# Patient Record
Sex: Female | Born: 1980
Health system: Southern US, Community
[De-identification: ages and names within clinical notes are randomized; demographics above are authoritative.]

## PROBLEM LIST (undated history)

## (undated) DIAGNOSIS — Z0000 Encounter for general adult medical examination without abnormal findings: Secondary | ICD-10-CM

## (undated) HISTORY — DX: Encounter for general adult medical examination without abnormal findings: Z00.00

## (undated) HISTORY — PX: NO PAST SURGERIES: SHX2092

---

## 2008-09-02 ENCOUNTER — Other Ambulatory Visit: Admission: RE | Admit: 2008-09-02 | Discharge: 2008-09-02 | Payer: Self-pay | Admitting: Family Medicine

## 2009-09-07 ENCOUNTER — Other Ambulatory Visit: Admission: RE | Admit: 2009-09-07 | Discharge: 2009-09-07 | Payer: Self-pay | Admitting: Family Medicine

## 2010-09-03 ENCOUNTER — Other Ambulatory Visit
Admission: RE | Admit: 2010-09-03 | Discharge: 2010-09-03 | Payer: Self-pay | Source: Home / Self Care | Admitting: Family Medicine

## 2010-09-03 ENCOUNTER — Other Ambulatory Visit: Payer: Self-pay | Admitting: Family Medicine

## 2011-05-22 ENCOUNTER — Emergency Department (HOSPITAL_COMMUNITY)
Admission: EM | Admit: 2011-05-22 | Discharge: 2011-05-22 | Disposition: A | Discharge status: Home / Self Care | Payer: BC Managed Care – PPO | Attending: Emergency Medicine | Admitting: Emergency Medicine

## 2011-05-22 DIAGNOSIS — Y9229 Other specified public building as the place of occurrence of the external cause: Secondary | ICD-10-CM | POA: Insufficient documentation

## 2011-05-22 DIAGNOSIS — S01501A Unspecified open wound of lip, initial encounter: Secondary | ICD-10-CM | POA: Insufficient documentation

## 2011-05-22 DIAGNOSIS — W268XXA Contact with other sharp object(s), not elsewhere classified, initial encounter: Secondary | ICD-10-CM | POA: Insufficient documentation

## 2014-03-27 ENCOUNTER — Emergency Department (HOSPITAL_COMMUNITY): Payer: PRIVATE HEALTH INSURANCE

## 2014-03-27 ENCOUNTER — Emergency Department (HOSPITAL_COMMUNITY)
Admission: EM | Admit: 2014-03-27 | Discharge: 2014-03-28 | Disposition: A | Discharge status: Home / Self Care | Payer: PRIVATE HEALTH INSURANCE | Attending: Emergency Medicine | Admitting: Emergency Medicine

## 2014-03-27 ENCOUNTER — Encounter (HOSPITAL_COMMUNITY): Payer: Self-pay | Admitting: Emergency Medicine

## 2014-03-27 DIAGNOSIS — M549 Dorsalgia, unspecified: Secondary | ICD-10-CM | POA: Insufficient documentation

## 2014-03-27 DIAGNOSIS — Z3202 Encounter for pregnancy test, result negative: Secondary | ICD-10-CM | POA: Insufficient documentation

## 2014-03-27 DIAGNOSIS — R071 Chest pain on breathing: Secondary | ICD-10-CM | POA: Insufficient documentation

## 2014-03-27 DIAGNOSIS — F17200 Nicotine dependence, unspecified, uncomplicated: Secondary | ICD-10-CM | POA: Insufficient documentation

## 2014-03-27 DIAGNOSIS — R0789 Other chest pain: Secondary | ICD-10-CM

## 2014-03-27 LAB — BASIC METABOLIC PANEL
Anion gap: 15 (ref 5–15)
BUN: 11 mg/dL (ref 6–23)
CO2: 20 mEq/L (ref 19–32)
Calcium: 9.2 mg/dL (ref 8.4–10.5)
Chloride: 104 mEq/L (ref 96–112)
Creatinine, Ser: 0.7 mg/dL (ref 0.50–1.10)
Glucose, Bld: 85 mg/dL (ref 70–99)
POTASSIUM: 4 meq/L (ref 3.7–5.3)
Sodium: 139 mEq/L (ref 137–147)

## 2014-03-27 LAB — CBC
HCT: 39.8 % (ref 36.0–46.0)
Hemoglobin: 13.4 g/dL (ref 12.0–15.0)
MCH: 31.2 pg (ref 26.0–34.0)
MCHC: 33.7 g/dL (ref 30.0–36.0)
MCV: 92.6 fL (ref 78.0–100.0)
Platelets: 230 10*3/uL (ref 150–400)
RBC: 4.3 MIL/uL (ref 3.87–5.11)
RDW: 12.5 % (ref 11.5–15.5)
WBC: 9.6 10*3/uL (ref 4.0–10.5)

## 2014-03-27 LAB — I-STAT TROPONIN, ED: TROPONIN I, POC: 0 ng/mL (ref 0.00–0.08)

## 2014-03-27 LAB — POC URINE PREG, ED: Preg Test, Ur: NEGATIVE

## 2014-03-27 MED ORDER — OXYCODONE-ACETAMINOPHEN 5-325 MG PO TABS
1.0000 | ORAL_TABLET | Freq: Once | ORAL | Status: AC
  Administered 2014-03-27: 1 via ORAL
  Filled 2014-03-27: qty 1

## 2014-03-27 NOTE — ED Notes (Signed)
Spoke with Melvenia BeamShari, [place] regarding pt.  Chest pain protocol followed.

## 2014-03-27 NOTE — ED Notes (Addendum)
C/o sudden onset of sharp pain under L shoulder blade x 30 min.  States if she stands up straight the pain is unbearable but if she bends over it relieves pain a little bit.  No known injury.  Denies chest pain/sob.

## 2014-03-28 LAB — I-STAT TROPONIN, ED: Troponin i, poc: 0 ng/mL (ref 0.00–0.08)

## 2014-03-28 MED ORDER — OXYCODONE-ACETAMINOPHEN 5-325 MG PO TABS
2.0000 | ORAL_TABLET | Freq: Once | ORAL | Status: AC
  Administered 2014-03-28: 2 via ORAL
  Filled 2014-03-28: qty 2

## 2014-03-28 MED ORDER — OXYCODONE-ACETAMINOPHEN 5-325 MG PO TABS: 1.0000 | ORAL_TABLET | Freq: Three times a day (TID) | ORAL | Status: DC | PRN

## 2014-03-28 NOTE — ED Provider Notes (Signed)
CSN: [id number]     Arrival date & time 03/27/14  1958 History   First MD Initiated Contact with Patient 03/28/14 [phone]     Chief Complaint  Patient presents with  . Back Pain     (Consider location/radiation/quality/duration/timing/severity/associated sxs/prior Treatment) HPI Sudden sharp stabbing left upper posterior back pain constant worse with position changes no associated symptoms; no fever, cough, CP, SOB, weak, numb, syncope.  Partially reproducible left upper back tenderness  PERC neg  History reviewed. No pertinent past medical history. History reviewed. No pertinent past surgical history. No family history on file. History  Substance Use Topics  . Smoking status: Current Every Day Smoker  . Smokeless tobacco: Not on file  . Alcohol Use: Yes   OB History   Grav Para Term Preterm Abortions TAB SAB Ect Mult Living                 Review of Systems  10 Systems reviewed and are negative for acute change except as noted in the HPI.  Allergies  Review of patient's allergies indicates no known allergies.  Home Medications   Prior to Admission medications   Medication Sig Start Date End Date Taking? Authorizing Provider  oxyCODONE-acetaminophen (PERCOCET) 5-325 MG per tablet Take 1-2 tablets by mouth every 8 (eight) hours as needed for severe pain. 03/28/14   Hurman HornJohn M Niasha Devins, MD   BP 115/67  Pulse 69  Temp(Src) 98.1 F (36.7 C) (Oral)  Resp 17  Ht 5\' 4"  (1.626 m)  Wt 146 lb (66.225 kg)  BMI 25.05 kg/m2  SpO2 98% Physical Exam  Nursing note and vitals reviewed. Constitutional:  Awake, alert, nontoxic appearance.  HENT:  Head: Atraumatic.  Eyes: Right eye exhibits no discharge. Left eye exhibits no discharge.  Neck: Neck supple.  Cardiovascular: Normal rate and regular rhythm.   No murmur heard. Pulmonary/Chest: Effort normal and breath sounds normal. No respiratory distress. She has no wheezes. She has no rales. She exhibits no tenderness.  Normal RA pulse  ox see VS  Abdominal: Soft. There is no tenderness. There is no rebound.  Musculoskeletal: She exhibits no edema and no tenderness.  Baseline ROM, no obvious new focal weakness. Partially reproducible left upper back tenderness  Neurological: She is alert.  Mental status and motor strength appears baseline for patient and situation.  Skin: No rash noted.  Psychiatric: She has a normal mood and affect.    ED Course  Procedures (including critical care time) Labs Review Labs Reviewed  CBC  BASIC METABOLIC PANEL  I-STAT TROPOININ, ED  POC URINE PREG, ED  I-STAT TROPOININ, ED    Imaging Review No results found. Dg Chest 2 View  03/27/2014   CLINICAL DATA:  Left-sided chest and upper back pain under scapula today  EXAM: CHEST  2 VIEW  COMPARISON:  None.  FINDINGS: The heart size and mediastinal contours are within normal limits. Both lungs are clear. The visualized skeletal structures are unremarkable.  IMPRESSION: No active cardiopulmonary disease.   Electronically Signed   By: Esperanza Heiraymond  Rubner M.D.   On: 03/27/2014 20:56   EKG Interpretation   Date/Time:  Thursday March 27 2014 20:11:10 EDT Ventricular Rate:  92 PR Interval:  134 QRS Duration: 74 QT Interval:  354 QTC Calculation: 437 R Axis:   54 Text Interpretation:  Normal sinus rhythm Normal ECG No previous ECGs  available Confirmed by [facility]  MD, Jonny RuizJOHN ([id number]) on 03/28/2014 1:37:53 AM      MDM   Final  diagnoses:  Chest wall pain    Patient informed of clinical course, understand medical decision-making process, and agree with plan. I doubt any other EMC precluding discharge at this time including, but not necessarily limited to the following:AMI, PE, PTX.   Hurman Horn, MD 03/29/14 2251

## 2014-03-28 NOTE — Discharge Instructions (Signed)
You have been diagnosed by your caregiver as having chest wall pain. °SEEK IMMEDIATE MEDICAL ATTENTION IF: °You develop a fever.  °Your chest pains become severe or intolerable.  °You develop new, unexplained symptoms (problems).  °You develop shortness of breath, nausea, vomiting, sweating or feel light headed.  °You develop a new cough or you cough up blood. ° °

## 2014-03-28 NOTE — ED Notes (Signed)
Patient offered wheelchair, patient declined. RN escorted to exit. 

## 2015-02-05 ENCOUNTER — Other Ambulatory Visit: Payer: Self-pay | Admitting: Certified Nurse Midwife

## 2017-02-24 DIAGNOSIS — R197 Diarrhea, unspecified: Secondary | ICD-10-CM | POA: Diagnosis not present

## 2017-03-28 DIAGNOSIS — K529 Noninfective gastroenteritis and colitis, unspecified: Secondary | ICD-10-CM | POA: Diagnosis not present

## 2017-03-29 DIAGNOSIS — K529 Noninfective gastroenteritis and colitis, unspecified: Secondary | ICD-10-CM | POA: Diagnosis not present

## 2017-03-31 DIAGNOSIS — R197 Diarrhea, unspecified: Secondary | ICD-10-CM | POA: Diagnosis not present

## 2017-09-11 DIAGNOSIS — J209 Acute bronchitis, unspecified: Secondary | ICD-10-CM | POA: Diagnosis not present

## 2017-10-02 DIAGNOSIS — Z803 Family history of malignant neoplasm of breast: Secondary | ICD-10-CM | POA: Diagnosis not present

## 2017-10-02 DIAGNOSIS — Z1239 Encounter for other screening for malignant neoplasm of breast: Secondary | ICD-10-CM | POA: Diagnosis not present

## 2017-10-02 DIAGNOSIS — Z6826 Body mass index (BMI) 26.0-26.9, adult: Secondary | ICD-10-CM | POA: Diagnosis not present

## 2017-10-02 DIAGNOSIS — Z01419 Encounter for gynecological examination (general) (routine) without abnormal findings: Secondary | ICD-10-CM | POA: Diagnosis not present

## 2017-10-05 ENCOUNTER — Other Ambulatory Visit: Payer: Self-pay | Admitting: Obstetrics and Gynecology

## 2017-10-05 DIAGNOSIS — Z803 Family history of malignant neoplasm of breast: Secondary | ICD-10-CM

## 2017-11-15 DIAGNOSIS — Z809 Family history of malignant neoplasm, unspecified: Secondary | ICD-10-CM | POA: Diagnosis not present

## 2018-09-17 DIAGNOSIS — S99912A Unspecified injury of left ankle, initial encounter: Secondary | ICD-10-CM | POA: Diagnosis not present

## 2018-10-22 DIAGNOSIS — Z6826 Body mass index (BMI) 26.0-26.9, adult: Secondary | ICD-10-CM | POA: Diagnosis not present

## 2018-10-22 DIAGNOSIS — Z01419 Encounter for gynecological examination (general) (routine) without abnormal findings: Secondary | ICD-10-CM | POA: Diagnosis not present

## 2018-11-09 DIAGNOSIS — R05 Cough: Secondary | ICD-10-CM | POA: Diagnosis not present

## 2019-04-08 ENCOUNTER — Other Ambulatory Visit: Payer: Self-pay

## 2019-04-08 DIAGNOSIS — Z20822 Contact with and (suspected) exposure to covid-19: Secondary | ICD-10-CM

## 2019-04-10 LAB — NOVEL CORONAVIRUS, NAA: SARS-CoV-2, NAA: DETECTED — AB

## 2019-05-27 ENCOUNTER — Ambulatory Visit: Payer: PRIVATE HEALTH INSURANCE | Admitting: Neurology

## 2019-09-30 ENCOUNTER — Ambulatory Visit: Payer: 59 | Attending: Internal Medicine

## 2019-09-30 DIAGNOSIS — Z20822 Contact with and (suspected) exposure to covid-19: Secondary | ICD-10-CM

## 2019-10-01 LAB — NOVEL CORONAVIRUS, NAA: SARS-CoV-2, NAA: NOT DETECTED

## 2020-01-09 ENCOUNTER — Other Ambulatory Visit: Payer: Self-pay | Admitting: Obstetrics and Gynecology

## 2020-01-09 DIAGNOSIS — Z9189 Other specified personal risk factors, not elsewhere classified: Secondary | ICD-10-CM

## 2021-03-02 DIAGNOSIS — Z6825 Body mass index (BMI) 25.0-25.9, adult: Secondary | ICD-10-CM | POA: Diagnosis not present

## 2021-03-02 DIAGNOSIS — Z1231 Encounter for screening mammogram for malignant neoplasm of breast: Secondary | ICD-10-CM | POA: Diagnosis not present

## 2021-03-02 DIAGNOSIS — Z01419 Encounter for gynecological examination (general) (routine) without abnormal findings: Secondary | ICD-10-CM | POA: Diagnosis not present

## 2021-03-08 ENCOUNTER — Other Ambulatory Visit: Payer: Self-pay | Admitting: Obstetrics and Gynecology

## 2021-03-08 DIAGNOSIS — Z9189 Other specified personal risk factors, not elsewhere classified: Secondary | ICD-10-CM

## 2021-03-08 DIAGNOSIS — R928 Other abnormal and inconclusive findings on diagnostic imaging of breast: Secondary | ICD-10-CM

## 2021-03-15 ENCOUNTER — Ambulatory Visit: Payer: Self-pay

## 2021-03-15 ENCOUNTER — Other Ambulatory Visit: Payer: Self-pay

## 2021-03-15 ENCOUNTER — Ambulatory Visit
Admission: RE | Admit: 2021-03-15 | Discharge: 2021-03-15 | Disposition: A | Discharge status: Home / Self Care | Payer: BC Managed Care – PPO | Source: Ambulatory Visit | Attending: Obstetrics and Gynecology | Admitting: Obstetrics and Gynecology

## 2021-03-15 DIAGNOSIS — R922 Inconclusive mammogram: Secondary | ICD-10-CM | POA: Diagnosis not present

## 2021-03-15 DIAGNOSIS — Z803 Family history of malignant neoplasm of breast: Secondary | ICD-10-CM | POA: Diagnosis not present

## 2021-03-15 DIAGNOSIS — R928 Other abnormal and inconclusive findings on diagnostic imaging of breast: Secondary | ICD-10-CM | POA: Diagnosis not present

## 2021-03-19 ENCOUNTER — Other Ambulatory Visit: Payer: 59

## 2021-08-12 DIAGNOSIS — R051 Acute cough: Secondary | ICD-10-CM | POA: Diagnosis not present

## 2022-02-19 DIAGNOSIS — W57XXXA Bitten or stung by nonvenomous insect and other nonvenomous arthropods, initial encounter: Secondary | ICD-10-CM | POA: Diagnosis not present

## 2022-02-19 DIAGNOSIS — S20469A Insect bite (nonvenomous) of unspecified back wall of thorax, initial encounter: Secondary | ICD-10-CM | POA: Diagnosis not present

## 2022-02-19 DIAGNOSIS — T148XXA Other injury of unspecified body region, initial encounter: Secondary | ICD-10-CM | POA: Diagnosis not present

## 2022-04-07 DIAGNOSIS — Z6824 Body mass index (BMI) 24.0-24.9, adult: Secondary | ICD-10-CM | POA: Diagnosis not present

## 2022-04-07 DIAGNOSIS — Z1231 Encounter for screening mammogram for malignant neoplasm of breast: Secondary | ICD-10-CM | POA: Diagnosis not present

## 2022-04-07 DIAGNOSIS — Z01419 Encounter for gynecological examination (general) (routine) without abnormal findings: Secondary | ICD-10-CM | POA: Diagnosis not present

## 2022-04-08 ENCOUNTER — Other Ambulatory Visit: Payer: Self-pay | Admitting: Obstetrics and Gynecology

## 2022-04-08 DIAGNOSIS — Z9189 Other specified personal risk factors, not elsewhere classified: Secondary | ICD-10-CM

## 2022-10-03 DIAGNOSIS — F17200 Nicotine dependence, unspecified, uncomplicated: Secondary | ICD-10-CM | POA: Diagnosis not present

## 2022-10-03 DIAGNOSIS — R55 Syncope and collapse: Secondary | ICD-10-CM | POA: Diagnosis not present

## 2022-10-10 ENCOUNTER — Ambulatory Visit: Payer: BC Managed Care – PPO | Admitting: Internal Medicine

## 2022-11-07 DIAGNOSIS — F17200 Nicotine dependence, unspecified, uncomplicated: Secondary | ICD-10-CM | POA: Diagnosis not present

## 2022-11-07 DIAGNOSIS — R6882 Decreased libido: Secondary | ICD-10-CM | POA: Diagnosis not present

## 2023-03-13 IMAGING — MG MM DIGITAL DIAGNOSTIC UNILAT*L* W/ TOMO W/ CAD
4 series · 4 of 12 positions shown · non-contrast
Comparison: Previous exam(s).

CLINICAL DATA: 40-year-old female recalled from screening mammogram
dated 03/02/2021 for a possible left breast asymmetry. Patient has a
family history of breast cancer in mother, great grandmother and
great great grandmother.

EXAM:
DIGITAL DIAGNOSTIC UNILATERAL LEFT MAMMOGRAM WITH TOMOSYNTHESIS AND
CAD
TECHNIQUE: Left digital diagnostic mammography and breast tomosynthesis was
performed. The images were evaluated with computer-aided detection.

[L MLO synth-2D]
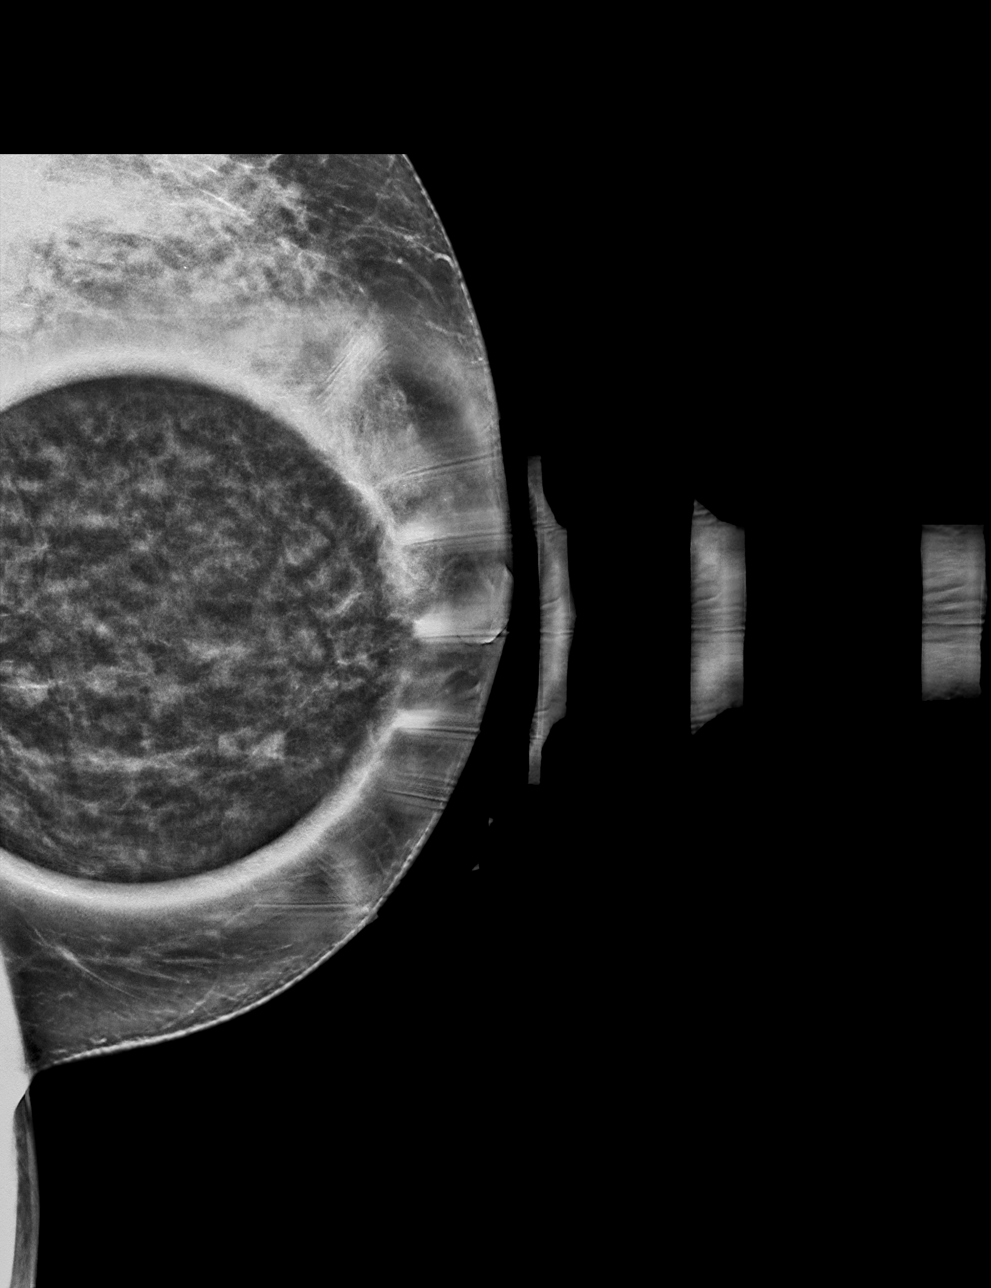

[L ML synth-2D]
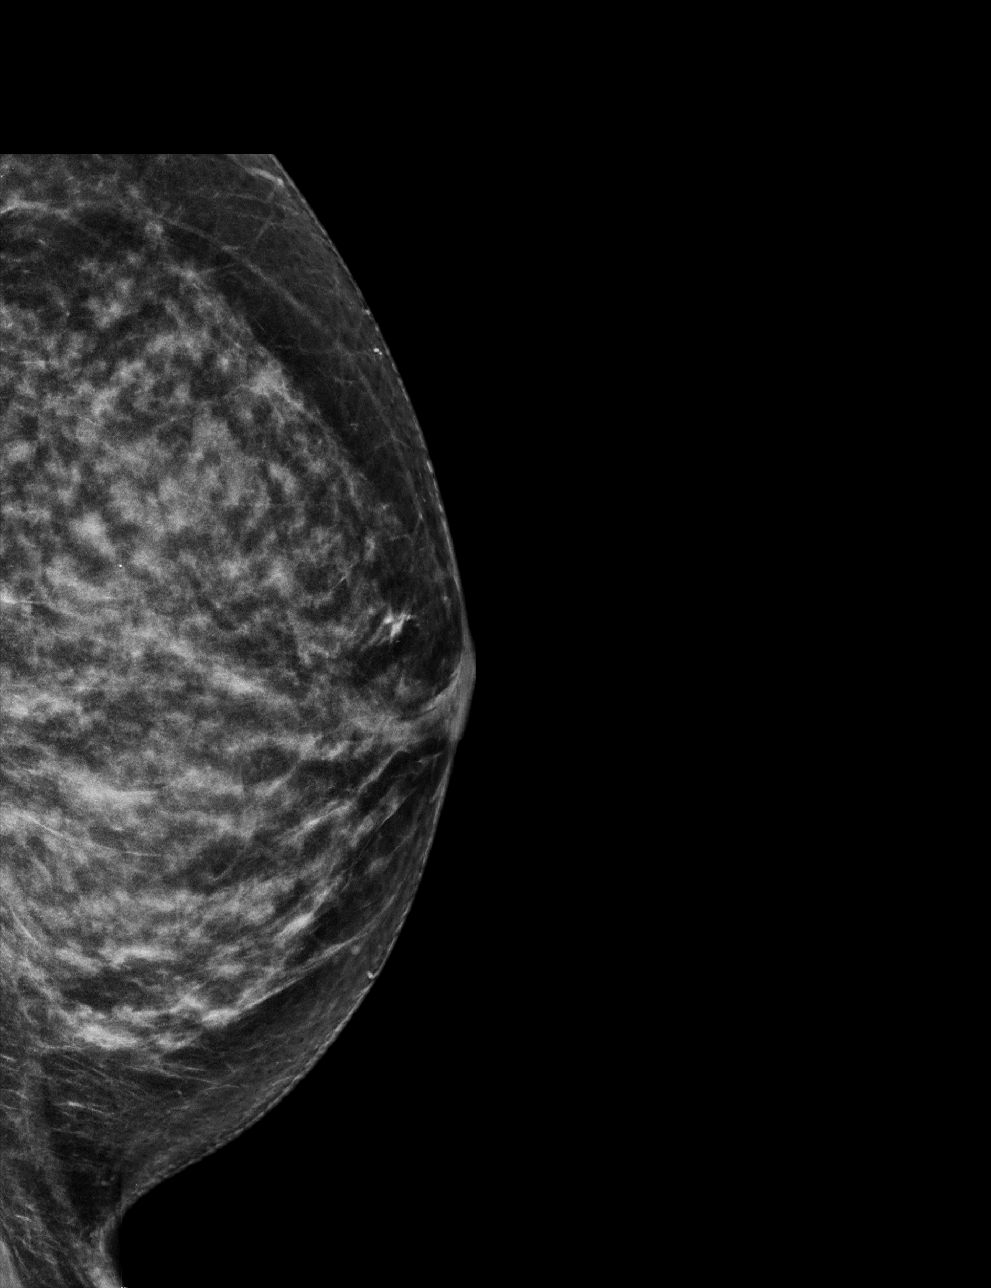

[L MLO tomo · tomo slice 31/61.0]
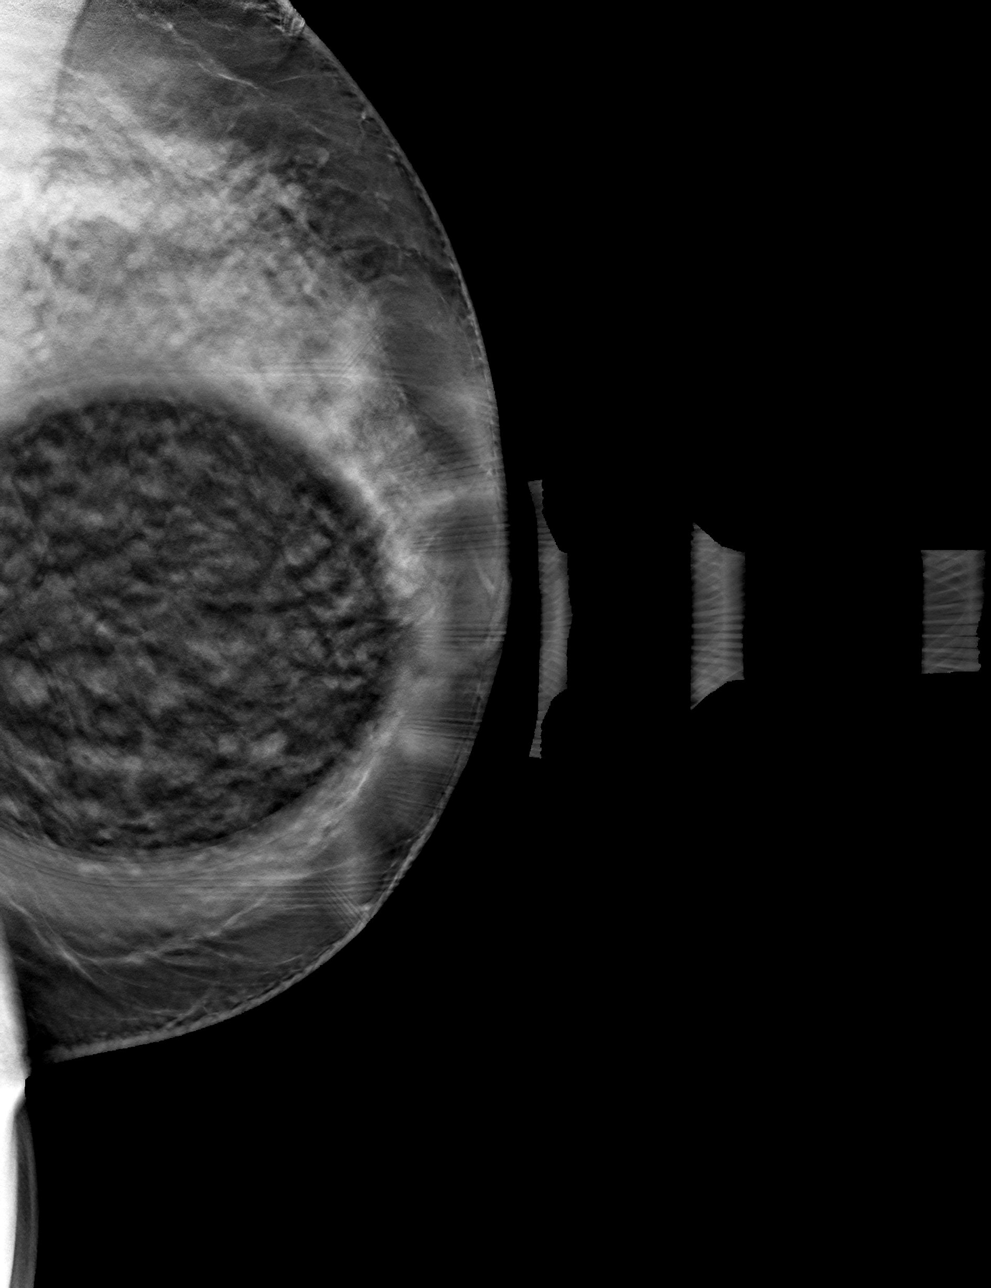

[L ML tomo · tomo slice 32/63.0]
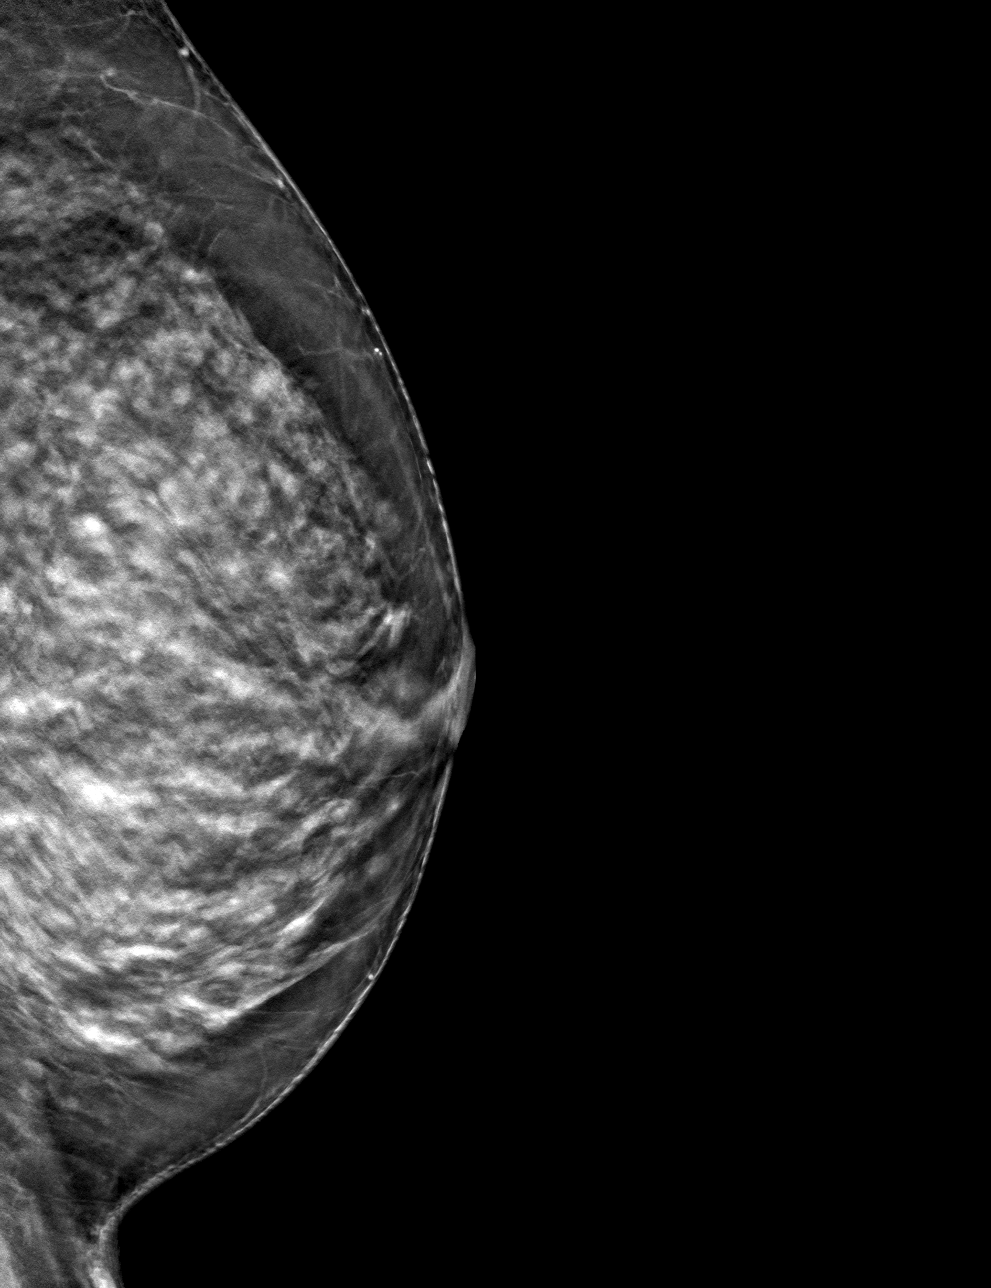

[4 of 12 positions shown; findings below may reference images not displayed]

ACR Breast Density Category c: The breast tissue is heterogeneously
dense, which may obscure small masses.
FINDINGS: Previously described, possible asymmetry in the central left breast
is seen on the MLO projection resolves into well dispersed
fibroglandular tissue on today's additional views. No suspicious
findings are identified.
IMPRESSION: No mammographic evidence of malignancy.

RECOMMENDATION:
1.  Screening mammogram in one year.(Code:VB-P-R79)
2. The American Cancer Society recommends annual MRI and mammography
in patients with an estimated lifetime risk of developing breast
cancer greater than 20 - 25%, or who are known or suspected to be
positive for the breast cancer gene.

I have discussed the findings and recommendations with the patient.
If applicable, a reminder letter will be sent to the patient
regarding the next appointment.

BI-RADS CATEGORY  1: Negative.

## 2023-04-13 DIAGNOSIS — Z01419 Encounter for gynecological examination (general) (routine) without abnormal findings: Secondary | ICD-10-CM | POA: Diagnosis not present

## 2023-04-13 DIAGNOSIS — Z6820 Body mass index (BMI) 20.0-20.9, adult: Secondary | ICD-10-CM | POA: Diagnosis not present

## 2023-04-13 DIAGNOSIS — Z1231 Encounter for screening mammogram for malignant neoplasm of breast: Secondary | ICD-10-CM | POA: Diagnosis not present

## 2023-06-22 ENCOUNTER — Other Ambulatory Visit: Payer: Self-pay | Admitting: Family Medicine

## 2023-06-22 DIAGNOSIS — R109 Unspecified abdominal pain: Secondary | ICD-10-CM | POA: Diagnosis not present

## 2023-06-23 ENCOUNTER — Ambulatory Visit
Admission: RE | Admit: 2023-06-23 | Discharge: 2023-06-23 | Disposition: A | Discharge status: Home / Self Care | Payer: BC Managed Care – PPO | Source: Ambulatory Visit | Attending: Family Medicine | Admitting: Family Medicine

## 2023-06-23 DIAGNOSIS — R109 Unspecified abdominal pain: Secondary | ICD-10-CM | POA: Diagnosis not present

## 2024-04-15 DIAGNOSIS — Z1231 Encounter for screening mammogram for malignant neoplasm of breast: Secondary | ICD-10-CM | POA: Diagnosis not present

## 2024-04-15 DIAGNOSIS — Z124 Encounter for screening for malignant neoplasm of cervix: Secondary | ICD-10-CM | POA: Diagnosis not present

## 2024-04-15 DIAGNOSIS — Z1151 Encounter for screening for human papillomavirus (HPV): Secondary | ICD-10-CM | POA: Diagnosis not present

## 2024-04-15 DIAGNOSIS — Z6820 Body mass index (BMI) 20.0-20.9, adult: Secondary | ICD-10-CM | POA: Diagnosis not present

## 2024-04-15 DIAGNOSIS — Z01419 Encounter for gynecological examination (general) (routine) without abnormal findings: Secondary | ICD-10-CM | POA: Diagnosis not present

## 2024-04-18 ENCOUNTER — Other Ambulatory Visit: Payer: Self-pay | Admitting: Obstetrics and Gynecology

## 2024-04-18 DIAGNOSIS — Z9189 Other specified personal risk factors, not elsewhere classified: Secondary | ICD-10-CM

## 2024-04-19 ENCOUNTER — Other Ambulatory Visit: Payer: Self-pay | Admitting: Obstetrics and Gynecology

## 2024-04-19 DIAGNOSIS — R928 Other abnormal and inconclusive findings on diagnostic imaging of breast: Secondary | ICD-10-CM

## 2024-04-26 ENCOUNTER — Ambulatory Visit
Admission: RE | Admit: 2024-04-26 | Discharge: 2024-04-26 | Disposition: A | Discharge status: Home / Self Care | Source: Ambulatory Visit | Attending: Obstetrics and Gynecology | Admitting: Obstetrics and Gynecology

## 2024-04-26 ENCOUNTER — Other Ambulatory Visit: Payer: Self-pay | Admitting: Obstetrics and Gynecology

## 2024-04-26 DIAGNOSIS — R928 Other abnormal and inconclusive findings on diagnostic imaging of breast: Secondary | ICD-10-CM

## 2024-04-26 DIAGNOSIS — R921 Mammographic calcification found on diagnostic imaging of breast: Secondary | ICD-10-CM

## 2024-05-01 ENCOUNTER — Other Ambulatory Visit: Payer: Self-pay | Admitting: Obstetrics and Gynecology

## 2024-05-01 ENCOUNTER — Ambulatory Visit
Admission: RE | Admit: 2024-05-01 | Discharge: 2024-05-01 | Disposition: A | Discharge status: Home / Self Care | Source: Ambulatory Visit | Attending: Obstetrics and Gynecology | Admitting: Obstetrics and Gynecology

## 2024-05-01 ENCOUNTER — Ambulatory Visit
Admission: RE | Admit: 2024-05-01 | Discharge: 2024-05-01 | Disposition: A | Discharge status: Home / Self Care | Source: Ambulatory Visit | Attending: Obstetrics and Gynecology

## 2024-05-01 DIAGNOSIS — R921 Mammographic calcification found on diagnostic imaging of breast: Secondary | ICD-10-CM

## 2024-05-21 ENCOUNTER — Encounter: Payer: Self-pay | Admitting: Pediatrics

## 2024-06-13 ENCOUNTER — Ambulatory Visit (AMBULATORY_SURGERY_CENTER)

## 2024-06-13 VITALS — Ht 64.0 in | Wt 122.0 lb

## 2024-06-13 DIAGNOSIS — Z1211 Encounter for screening for malignant neoplasm of colon: Secondary | ICD-10-CM

## 2024-06-13 MED ORDER — NA SULFATE-K SULFATE-MG SULF 17.5-3.13-1.6 GM/177ML PO SOLN: 1.0000 | Freq: Once | ORAL | 0 refills | Status: AC

## 2024-06-13 NOTE — Progress Notes (Signed)
 Pre visit completed via phone call; Patient verified name, DOB, and address; No egg or soy allergy known to patient;  No issues known to pt with past sedation with any surgeries or procedures; Patient denies ever being told they had issues or difficulty with intubation;  No FH of Malignant Hyperthermia; Pt is not on diet pills; Pt is not on home 02;  Pt is not on blood thinners;  Pt reports issues with constipation - patient reports she has a bowel movement about every other day; advised to take gentle laxative/stool softener, increase oral fluids, activity, fruits and veggies (that are allowed) during the 5 days prior to the procedure to assist with constipation;  No A fib or A flutter Have any cardiac testing pending--NO Insurance verified during PV appt--- BCBS PPO Pt can ambulate without assistance;  Pt denies use of chewing tobacco; Discussed diabetic/weight loss medication holds; Discussed NSAID holds; Checked BMI to be less than 50; Pt instructed to use Single[employer]com or GoodRx for a price reduction on prep  Patient's chart reviewed by Norleen Schillings CNRA prior to previsit and patient appropriate for the [facility];  Pre visit completed and red dot placed by patient's name on their procedure day (on provider's schedule);   Instructions sent to MyChart as well as printed and given to patient at time of PV appt;

## 2024-06-24 ENCOUNTER — Encounter: Payer: Self-pay | Admitting: Pediatrics

## 2024-06-26 NOTE — Progress Notes (Unsigned)
 [facility] History and Physical   Primary Care Physician:  Katina Pfeiffer, PA-C   Reason for Procedure:  Colorectal cancer screening  Plan:    Colonoscopy     HPI: Janet Jensen is a 43 y.o. female undergoing colonoscopy for colorectal cancer screening.  This is the patient's first colonoscopy.  No family history of colorectal cancer or polyps.  Patient denies current symptoms of rectal bleeding or change in bowel habits.   Past Medical History:  Diagnosis Date   Healthy adult on routine physical examination     Past Surgical History:  Procedure Laterality Date   NO PAST SURGERIES      Prior to Admission medications   Medication Sig Start Date End Date Taking? Authorizing Provider  buPROPion (ZYBAN) 150 MG 12 hr tablet Take 150 mg by mouth 2 (two) times daily. Patient taking differently: Take 150 mg by mouth daily.    [provider]  doxylamine, Sleep, (UNISOM) 25 MG tablet Take 25 mg by mouth at bedtime as needed for sleep.    [provider]  SLYND 4 MG TABS Take 1 tablet by mouth daily. 03/21/24   [provider]    Current Outpatient Medications  Medication Sig Dispense Refill   buPROPion (ZYBAN) 150 MG 12 hr tablet Take 150 mg by mouth 2 (two) times daily. (Patient taking differently: Take 150 mg by mouth daily.)     doxylamine, Sleep, (UNISOM) 25 MG tablet Take 25 mg by mouth at bedtime as needed for sleep.     SLYND 4 MG TABS Take 1 tablet by mouth daily.     Current Facility-Administered Medications  Medication Dose Route Frequency Provider Last Rate Last Admin   0.9 %  sodium chloride infusion  500 mL Intravenous Once Arma Reining, Inocente HERO, MD        Allergies as of 06/27/2024   (No Known Allergies)    Family History  Problem Relation Age of Onset   Breast cancer Mother 62   Breast cancer Maternal Grandmother    Colon polyps Neg Hx    Colon cancer Neg Hx    Esophageal cancer Neg Hx    Rectal cancer Neg Hx     Stomach cancer Neg Hx     Social History   Socioeconomic History   Marital status: Married    Spouse name: Not on file   Number of children: Not on file   Years of education: Not on file   Highest education level: Not on file  Occupational History   Not on file  Tobacco Use   Smoking status: Every Day    Current packs/day: 1.00    Types: Cigarettes   Smokeless tobacco: Not on file  Vaping Use   Vaping status: Never Used  Substance and Sexual Activity   Alcohol use: Not Currently   Drug use: No   Sexual activity: Not on file  Other Topics Concern   Not on file  Social History Narrative   Not on file   Social Drivers of Health   Financial Resource Strain: Not on file  Food Insecurity: Not on file  Transportation Needs: Not on file  Physical Activity: Not on file  Stress: Not on file  Social Connections: Not on file  Intimate Partner Violence: Not on file    Review of Systems:  All other review of systems negative except as mentioned in the HPI.  Physical Exam: Vital signs BP 107/64   Pulse 71   Temp 98.2 F (  36.8 C) (Skin)   Ht 5' 4 (1.626 m)   Wt 122 lb (55.3 kg)   SpO2 100%   BMI 20.94 kg/m   General:   Alert,  Well-developed, well-nourished, pleasant and cooperative in NAD Airway:  Mallampati 2 Lungs:  Clear throughout to auscultation.   Heart:  Regular rate and rhythm; no murmurs, clicks, rubs,  or gallops. Abdomen:  Soft, nontender and nondistended. Normal bowel sounds.   Neuro/Psych:  Normal mood and affect. A and O x 3  Inocente Hausen, MD [facility]

## 2024-06-27 ENCOUNTER — Encounter: Payer: Self-pay | Admitting: Pediatrics

## 2024-06-27 ENCOUNTER — Ambulatory Visit (AMBULATORY_SURGERY_CENTER): Admitting: Pediatrics

## 2024-06-27 VITALS — BP 110/70 | HR 69 | Temp 98.2°F | Resp 12 | Ht 64.0 in | Wt 122.0 lb

## 2024-06-27 DIAGNOSIS — K648 Other hemorrhoids: Secondary | ICD-10-CM | POA: Diagnosis not present

## 2024-06-27 DIAGNOSIS — Z1211 Encounter for screening for malignant neoplasm of colon: Secondary | ICD-10-CM | POA: Diagnosis not present

## 2024-06-27 MED ORDER — SODIUM CHLORIDE 0.9 % IV SOLN: 500.0000 mL | Freq: Once | INTRAVENOUS | Status: DC

## 2024-06-27 NOTE — Patient Instructions (Signed)
Resume previous diet Continue present medications There were no colon polyps seen today!  You will need another screening colonoscopy in 10 years, you will receive a letter at that time when you are due for the procedure.    Please call us at [phone] if you have a change in bowel habits, change in family history of colo-rectal cancer, rectal bleeding or other GI concern before that time.  Handouts/information given for hemorrhoids  YOU HAD AN ENDOSCOPIC PROCEDURE TODAY AT THE [facility]:   Refer to the procedure report that was given to you for any specific questions about what was found during the examination.  If the procedure report does not answer your questions, please call your gastroenterologist to clarify.  If you requested that your care partner not be given the details of your procedure findings, then the procedure report has been included in a sealed envelope for you to review at your convenience later.  YOU SHOULD EXPECT: Some feelings of bloating in the abdomen. Passage of more gas than usual.  Walking can help get rid of the air that was put into your GI tract during the procedure and reduce the bloating. If you had a lower endoscopy (such as a colonoscopy or flexible sigmoidoscopy) you may notice spotting of blood in your stool or on the toilet paper. If you underwent a bowel prep for your procedure, you may not have a normal bowel movement for a few days.  Please Note:  You might notice some irritation and congestion in your nose or some drainage.  This is from the oxygen used during your procedure.  There is no need for concern and it should clear up in a day or so.   SYMPTOMS TO REPORT IMMEDIATELY:  Following lower endoscopy (colonoscopy):  Excessive amounts of blood in the stool  Significant tenderness or worsening of abdominal pains  Swelling of the abdomen that is new, acute  Fever of 100F or higher  For urgent or emergent issues, a gastroenterologist  can be reached at any hour by calling [phone]. Do not use MyChart messaging for urgent concerns.   DIET:  We do recommend a small meal at first, but then you may proceed to your regular diet.  Drink plenty of fluids but you should avoid alcoholic beverages for 24 hours.  ACTIVITY:  You should plan to take it easy for the rest of today and you should NOT DRIVE or use heavy machinery until tomorrow (because of the sedation medicines used during the test).    FOLLOW UP: Our staff will call the number listed on your records the next business day following your procedure.  We will call around 7:15- 8:00 am to check on you and address any questions or concerns that you may have regarding the information given to you following your procedure. If we do not reach you, we will leave a message.     SIGNATURES/CONFIDENTIALITY: You and/or your care partner have signed paperwork which will be entered into your electronic medical record.  These signatures attest to the fact that that the information above on your After Visit Summary has been reviewed and is understood.  Full responsibility of the confidentiality of this discharge information lies with you and/or your care-partner.

## 2024-06-27 NOTE — Progress Notes (Signed)
 VS by CL  Pt's states no medical or surgical changes since previsit or office visit.

## 2024-06-27 NOTE — Op Note (Signed)
 [facility] Patient Name: Janet Jensen Procedure Date: 06/27/2024 8:50 AM MRN: [id number] Endoscopist: Inocente Hausen , MD, [id number] Age: 43 Referring MD:  Date of Birth: 06/13/1981 Gender: Female Account #: [id number] Procedure:                Colonoscopy Indications:              Screening for colorectal malignant neoplasm, This                            is the patient's first colonoscopy Medicines:                Monitored Anesthesia Care Procedure:                Pre-Anesthesia Assessment:                           - Prior to the procedure, a History and Physical                            was performed, and patient medications and                            allergies were reviewed. The patient's tolerance of                            previous anesthesia was also reviewed. The risks                            and benefits of the procedure and the sedation                            options and risks were discussed with the patient.                            All questions were answered, and informed consent                            was obtained. Prior Anticoagulants: The patient has                            taken no anticoagulant or antiplatelet agents. ASA                            Grade Assessment: I - A normal, healthy patient.                            After reviewing the risks and benefits, the patient                            was deemed in satisfactory condition to undergo the                            procedure.  After obtaining informed consent, the colonoscope                            was passed under direct vision. Throughout the                            procedure, the patient's blood pressure, pulse, and                            oxygen saturations were monitored continuously. The                            Olympus Scope SN: [id number] was introduced through                            the anus and advanced to the the  cecum, identified                            by appendiceal orifice and ileocecal valve. The                            colonoscopy was performed without difficulty. The                            patient tolerated the procedure well. The quality                            of the bowel preparation was good. The ileocecal                            valve, appendiceal orifice, and rectum were                            photographed. Scope In: 9:02:24 AM Scope Out: 9:16:10 AM Scope Withdrawal Time: 0 hours 9 minutes 1 second  Total Procedure Duration: 0 hours 13 minutes 46 seconds  Findings:                 The perianal and digital rectal examinations were                            normal. Pertinent negatives include normal                            sphincter tone and no palpable rectal lesions.                           The colon (entire examined portion) appeared normal.                           Internal hemorrhoids were found during retroflexion. Complications:            No immediate complications. Estimated blood loss:  None. Estimated Blood Loss:     Estimated blood loss: none. Impression:               - The entire examined colon is normal.                           - Internal hemorrhoids.                           - No specimens collected. Recommendation:           - Discharge patient to home (ambulatory).                           - Repeat colonoscopy in 10 years for screening                            purposes.                           - The findings and recommendations were discussed                            with the patient's family.                           - Return to referring physician.                           - Patient has a contact number available for                            emergencies. The signs and symptoms of potential                            delayed complications were discussed with the                            patient.  Return to normal activities tomorrow.                            Written discharge instructions were provided to the                            patient. Inocente Hausen, MD 06/27/2024 9:19:56 AM This report has been signed electronically.

## 2024-06-27 NOTE — Progress Notes (Signed)
 Sedate, gd SR, tolerated procedure well, VSS, report to RN

## 2024-06-28 ENCOUNTER — Telehealth: Payer: Self-pay

## 2024-06-28 NOTE — Telephone Encounter (Signed)
 Left message on follow up call.

## 2024-07-24 DIAGNOSIS — Z23 Encounter for immunization: Secondary | ICD-10-CM | POA: Diagnosis not present

## 2024-07-24 DIAGNOSIS — Z0000 Encounter for general adult medical examination without abnormal findings: Secondary | ICD-10-CM | POA: Diagnosis not present

## 2024-07-24 DIAGNOSIS — F17210 Nicotine dependence, cigarettes, uncomplicated: Secondary | ICD-10-CM | POA: Diagnosis not present
